# Patient Record
Sex: Female | Born: 1975 | Race: Black or African American | Hispanic: No | Marital: Married | Smoking: Never smoker
Health system: Southern US, Community
[De-identification: ages and names within clinical notes are randomized; demographics above are authoritative.]

## PROBLEM LIST (undated history)

## (undated) DIAGNOSIS — O321XX0 Maternal care for breech presentation, not applicable or unspecified: Secondary | ICD-10-CM

## (undated) DIAGNOSIS — R12 Heartburn: Secondary | ICD-10-CM

## (undated) DIAGNOSIS — I10 Essential (primary) hypertension: Secondary | ICD-10-CM

## (undated) DIAGNOSIS — D649 Anemia, unspecified: Secondary | ICD-10-CM

## (undated) DIAGNOSIS — O26899 Other specified pregnancy related conditions, unspecified trimester: Secondary | ICD-10-CM

## (undated) DIAGNOSIS — Z8619 Personal history of other infectious and parasitic diseases: Secondary | ICD-10-CM

## (undated) DIAGNOSIS — F419 Anxiety disorder, unspecified: Secondary | ICD-10-CM

## (undated) HISTORY — DX: Essential (primary) hypertension: I10

## (undated) HISTORY — DX: Anxiety disorder, unspecified: F41.9

## (undated) HISTORY — DX: Maternal care for breech presentation, not applicable or unspecified: O32.1XX0

## (undated) HISTORY — DX: Personal history of other infectious and parasitic diseases: Z86.19

## (undated) HISTORY — PX: TONSILLECTOMY: SUR1361

## (undated) HISTORY — PX: SALPINGOSTOMY: SHX2372

## (undated) HISTORY — PX: LAPAROSCOPY: SHX197

---

## 2003-02-07 ENCOUNTER — Other Ambulatory Visit: Admission: RE | Admit: 2003-02-07 | Discharge: 2003-02-07 | Payer: Self-pay | Admitting: Obstetrics and Gynecology

## 2003-07-13 ENCOUNTER — Encounter (INDEPENDENT_AMBULATORY_CARE_PROVIDER_SITE_OTHER): Payer: Self-pay

## 2003-07-13 ENCOUNTER — Ambulatory Visit (HOSPITAL_COMMUNITY): Admission: RE | Admit: 2003-07-13 | Discharge: 2003-07-13 | Payer: Self-pay | Admitting: Obstetrics and Gynecology

## 2003-10-20 ENCOUNTER — Ambulatory Visit (HOSPITAL_COMMUNITY): Admission: RE | Admit: 2003-10-20 | Discharge: 2003-10-20 | Payer: Self-pay | Admitting: Obstetrics and Gynecology

## 2003-11-19 ENCOUNTER — Inpatient Hospital Stay (HOSPITAL_COMMUNITY): Admission: AD | Admit: 2003-11-19 | Discharge: 2003-11-19 | Payer: Self-pay | Admitting: Obstetrics and Gynecology

## 2003-12-13 ENCOUNTER — Other Ambulatory Visit: Admission: RE | Admit: 2003-12-13 | Discharge: 2003-12-13 | Payer: Self-pay | Admitting: Obstetrics and Gynecology

## 2004-05-24 ENCOUNTER — Inpatient Hospital Stay (HOSPITAL_COMMUNITY): Admission: AD | Admit: 2004-05-24 | Discharge: 2004-05-24 | Payer: Self-pay | Admitting: Obstetrics and Gynecology

## 2004-06-20 ENCOUNTER — Inpatient Hospital Stay (HOSPITAL_COMMUNITY): Admission: AD | Admit: 2004-06-20 | Discharge: 2004-06-20 | Payer: Self-pay | Admitting: Obstetrics & Gynecology

## 2004-06-25 ENCOUNTER — Inpatient Hospital Stay (HOSPITAL_COMMUNITY): Admission: AD | Admit: 2004-06-25 | Discharge: 2004-06-28 | Payer: Self-pay | Admitting: Obstetrics & Gynecology

## 2004-06-26 ENCOUNTER — Encounter (INDEPENDENT_AMBULATORY_CARE_PROVIDER_SITE_OTHER): Payer: Self-pay | Admitting: Specialist

## 2005-02-19 ENCOUNTER — Encounter (INDEPENDENT_AMBULATORY_CARE_PROVIDER_SITE_OTHER): Payer: Self-pay | Admitting: *Deleted

## 2005-02-19 ENCOUNTER — Ambulatory Visit (HOSPITAL_BASED_OUTPATIENT_CLINIC_OR_DEPARTMENT_OTHER): Admission: RE | Admit: 2005-02-19 | Discharge: 2005-02-19 | Payer: Self-pay | Admitting: Otolaryngology

## 2005-02-19 ENCOUNTER — Ambulatory Visit (HOSPITAL_COMMUNITY): Admission: RE | Admit: 2005-02-19 | Discharge: 2005-02-19 | Payer: Self-pay | Admitting: Otolaryngology

## 2005-10-05 ENCOUNTER — Emergency Department (HOSPITAL_COMMUNITY): Admission: EM | Admit: 2005-10-05 | Discharge: 2005-10-06 | Payer: Self-pay | Admitting: *Deleted

## 2006-10-03 ENCOUNTER — Encounter: Admission: RE | Admit: 2006-10-03 | Discharge: 2006-10-03 | Payer: Self-pay | Admitting: Obstetrics and Gynecology

## 2009-08-14 ENCOUNTER — Emergency Department (HOSPITAL_COMMUNITY): Admission: EM | Admit: 2009-08-14 | Discharge: 2009-08-14 | Payer: Self-pay | Admitting: Family Medicine

## 2011-02-22 NOTE — Op Note (Signed)
NAME:  [name], [name]           ACCOUNT NO.:  [id number]   MEDICAL RECORD NO.:  [id number]          PATIENT TYPE:  AMB   LOCATION:  DSC                          FACILITY:  [facility]   PHYSICIAN:  Christopher E. Ezzard Standing, M.D.DATE OF BIRTH:  22-Feb-1976   DATE OF PROCEDURE:  02/19/2005  DATE OF DISCHARGE:                                 OPERATIVE REPORT   PREOPERATIVE DIAGNOSIS:  Chronic cryptic tonsillitis.   POSTOPERATIVE DIAGNOSIS:  Chronic cryptic tonsillitis.   OPERATION:  Tonsillectomy.   SURGEON:  Kristine Garbe. Ezzard Standing, M.D.   ANESTHESIA:  General endotracheal.   COMPLICATIONS:  None.   BRIEF CLINICAL NOTE:  Dominique Bowen is a 35 year old female who has had  chronic problems with her tonsils for a number of years.  She frequently  gets sore throats, as well as getting a lot of white debris building up  within the tonsil crypts.  She has tried gargling and conservative measures.  She has continued to have problems with white debris building up on her  tonsils and sore throats.  On examination, she has moderately large, 2+,  very cryptic tonsils.  She is taken to the operating room at this time for  tonsillectomy.   DESCRIPTION OF PROCEDURE:  After adequate endotracheal anesthesia, the  patient received 10 mg of Decadron IV preoperatively, as well as 1 g of  Ancef IV preoperatively.  A mouth gag was used to expose the oropharynx.  The left and right tonsils were resected from the tonsillar fossa using  cautery.  Care was taken to preserve the anteroposterior tonsillar pillars,  as well as the uvula.  Hemostasis was obtained with the cautery.  Following  this, the nasopharynx was examined with a mirror.  She had minimal adenoid  tissue.  This completed the procedure.  Shanty was awoke from anesthesia  and transferred to the recovery room.  Postoperatively doing well.   DISPOSITION:  Anola is discharged home later this morning on amoxicillin  suspension 400 mg b.i.d.  for one week, Tylenol/Lortab elixir 15-30 mL q.4h.  p.r.n. pain.  Will have her follow up in my office in two weeks for recheck.      CEN/MEDQ  D:  02/19/2005  T:  02/19/2005  Job:  [id number]

## 2011-02-22 NOTE — Op Note (Signed)
NAMEMarland Kitchen  FREEDA, SPIVEY NO.:  [id number]   MEDICAL RECORD NO.:  [id number]          PATIENT TYPE:  OUT   LOCATION:  DFTL                         FACILITY:  [facility]   PHYSICIAN:  Kristine Garbe. Ezzard Standing, M.D.DATE OF BIRTH:  03-29-1976   DATE OF PROCEDURE:  02/19/2005  DATE OF DISCHARGE:  02/19/2005                                 OPERATIVE REPORT   PREOPERATIVE DIAGNOSIS:  Chronic cryptic tonsillitis.   POSTOPERATIVE DIAGNOSIS:  Same.   PROCEDURE PERFORMED:  Tonsillectomy.   SURGEON:  Kristine Garbe. Ezzard Standing, M.D.   ANESTHESIA:  General endotracheal.   COMPLICATIONS:  None.   BRIEF CLINICAL NOTE:  Dominique Bowen is a 35 year old female who has had  intermittent sore throats with large cryptic tonsils with frequent  tonsilliths.  She is taken to the operating room at this time for  tonsillectomy.   DESCRIPTION OF PROCEDURE:  After adequate endotracheal anesthesia, a mouth  gag was used to expose the oropharynx.  The left and right tonsils were  separated from tonsillar fossa using cautery.  Care was taken to preserve  the anterior and possible tonsillar pillars as well as the uvula.  Hemostasis was obtained with cautery.  Following this, the nasopharynx was  examined.  She has minimal adenoid tissue.  Oropharynx was irrigated with  saline, and this completed the procedure.  The patient was subsequently  awoken from anesthesia and transferred to the recovery room doing well.   DISPOSITION:  Dominique Bowen is discharged home later this morning with amoxicillin  suspension 400 mg b.i.d. for one week.  Tylenol and Lortab elixir 15 to 30  ml q. 4h. p.r.n. pain.  She was told to follow up in my office in 2 weeks  for recheck.      CEN/MEDQ  D:  03/05/2005  T:  03/05/2005  Job:  [id number]

## 2011-02-22 NOTE — H&P (Signed)
NAME:  AVANELL, BANWART                     ACCOUNT NO.:  [id number]   MEDICAL RECORD NO.:  [id number]                   PATIENT TYPE:  MAT   LOCATION:  MATC                                 FACILITY:  [facility]   PHYSICIAN:  Genia Del, M.D.             DATE OF BIRTH:  08/28/1976   DATE OF ADMISSION:  06/25/2004  DATE OF DISCHARGE:                                HISTORY & PHYSICAL   PATIENT IDENTIFICATION:  Mrs. Dasher is a 35 year old G2 P0 A1 at 37+  weeks gestation.   REASON FOR ADMISSION:  Spontaneous rupture of membranes with clear fluid,  starting around 6 a.m. on June 25, 2004.   HISTORY OF PRESENT ILLNESS:  Clear fluid gush vaginally around 6 a.m., then  the fluid continued to leak.  The patient had mild uterine contractions, no  vaginal bleeding, fetal movements positive, no PIH symptoms.   PAST MEDICAL HISTORY:  History of anemia, obesity.   PAST SURGICAL HISTORY:  Laparotomy 2004, ectopic pregnancy.   PAST OBSTETRICAL HISTORY:  G1 - October 2004, 6 weeks, ectopic on the right  side, salpingostomy done.   PAST GYNECOLOGICAL HISTORY:  Recurrent yeast infection, infertility.   FAMILY HISTORY:  Positive for chronic hypertension and diabetes mellitus.   ALLERGIES:  No known drug allergies.  Allergy to AVOCADOS.   CURRENT MEDICATIONS:  Iron supplement and Prenate vitamins.   SOCIAL HISTORY:  Nonsmoker, married.   HISTORY OF PRESENT PREGNANCY:  First trimester spotting which resolved.  Labs in the first trimester showed a hemoglobin at 12.6, platelets 304, O  positive, antibody negative, hemoglobin electrophoresis within normal  limits, RPR nonreactive, HBsAg nonreactive, HIV nonreactive, rubella titer  is immune.  In the second trimester, triple test was within normal limits.  The ultrasound review of anatomy was within normal limits at [facility].  Placenta  was normal.  In the third trimester, 1-hour GTT was normal at 121.  Group B  strep was negative.   Ultrasound for interval growth showed an estimated  fetal weight at the 50th percentile at 29+ weeks, as well as at 35+ weeks.  Fetal well-being surveillance was done closely because of an increased blood  pressure ranging in the 130s to 140s over 80 to 90s after the 31st week.  NSTs and biophysical profiles were reassuring.  Group B strep was negative  at 35 weeks.  PIH labs were within normal limits.   REVIEW OF SYSTEMS:  CONSTITUTIONAL:  Negative.  HEENT:  Negative.  CARDIOVASCULAR, RESPIRATORY, GI, URO, NEURO, DERMATO, ENDOCRINO:  Negative.   PHYSICAL EXAMINATION:  GENERAL:  No apparent distress.  VITAL SIGNS:  Blood pressure was 130 to 140 over 80 to 90 at maternity  admission, respiratory rate normal, regular pulse, afebrile.  LUNGS:  Clear.  HEART:  Regular cardiac rhythm.  ABDOMEN:  Gravid uterine, cephalic presentation.  PELVIC:  Vaginal exam 3 cm dilated, 90% effaced, vertex, -2, clear amniotic  fluid confirmed spontaneous rupture of  membranes.  EXTREMITIES:  Lower limbs normal.   Fetal heart rate monitoring 140s, good variability with accelerations, no  decelerations.  Increased uterine contractions every 3 minutes, lasting 50-  60 seconds.   LABORATORY DATA:  Platelets were 299, hemoglobin 12.4.  AST 20, ALT 16, uric  acid was 6.1, and LDH 128.   IMPRESSION:  Gravida 2 para 0 at 37+ weeks with spontaneous rupture of  membranes entering spontaneous labor.  Fetal well-being reassuring.  Mild  pregnancy-induced hypertension with PIH labs within normal limits except  mildly elevated uric acid at 6.1.   PLAN:  1.  Admit to labor and delivery.  2.  Expectant management towards probable vaginal delivery.  3.  Monitoring.  4.  No magnesium sulfate for now.  5.  If the patient reaches 12 hours after spontaneous rupture of membranes,      will start on antibiotics IV.      ML/MEDQ  D:  06/25/2004  T:  06/25/2004  Job:  [id number]

## 2011-02-22 NOTE — Op Note (Signed)
Dominique Bowen, Dominique Bowen                       ACCOUNT NO.:  [id number]   MEDICAL RECORD NO.:  [id number]                   PATIENT TYPE:  AMB   LOCATION:  SDC                                  FACILITY:  [facility]   PHYSICIAN:  Maxie Better, M.D.            DATE OF BIRTH:  [birth year]   DATE OF PROCEDURE:  07/13/2003  DATE OF DISCHARGE:                                 OPERATIVE REPORT   PREOPERATIVE DIAGNOSES:  1. First trimester vaginal bleeding.  2. Abnormal ultrasound.  3. Abnormal rising quantitative HCG.   PROCEDURE:  1. Dilatation and curettage with frozen section.  2. Operative laparoscopy.  3. Right salpingostomy.   POSTOPERATIVE DIAGNOSES:  1. Right ampullary ectopic pregnancy.  2. Hemoperitoneum.   ANESTHESIA:  General.   SURGEON:  Maxie Better, M.D.   ASSISTANT:  For the laparoscopic portion Genia Del, M.D.   INDICATIONS FOR PROCEDURE:  This is a 35 year old gravida 1, para 0, married  black female.  Last menstrual period around June 17, 2003, x1 day with  known polycystic ovarian syndrome for which she is on Glucophage and who has  anovulatory cycles with a resultant pregnancy secondary to Clomid  conception.  The patient has been followed with quantitative HCG and  subsequent abnormal rising of the HCG has resulted in a repeat ultrasound  being performed on July 13, 2003, which showed a moderate amount of free  fluid, a right abnormal adnexal structure, and no intrauterine pregnancy.  The patient has had minimal cramping on the right side.  She has noticed  increased vaginal bleeding, particularly today.  The patient reports that  her cramping began over the past two days.  After the findings of the  ultrasound the patient was informed that she would need surgical evaluation  and management.  The patient desires preservation of her fallopian tube if  at all possible.  Risks and benefits of planned procedures were explained to  the  patient and re-explained to the patient and her husband when he  presented at the office for the preop visit.  The quantitative HCG today was  1600.  Previous quantitative HCG on July 10, 2003, was 1856.  The patient  had had an ultrasound on July 08, 2003, that did not reveal an  intrauterine pregnancy as a thickened endometrium but both ovaries were  normal at that time.  The patient's exam was unremarkable.  Consent was  signed.  The patient was transferred to the operating room.   FINDINGS:  At the time of surgery is scant tissue in the endometrial cavity,  300 mL of hemoperitoneum, normal uterus, normal left tube and ovary, normal  right ovary, and the distal one-third plus of the right fallopian tube was  dilated and the fimbriated end containing probable products of conception  consistent with an attempted tubal abortion.  Normal appendix, normal liver  edge.   PROCEDURE:  Under adequate general anesthesia, the patient was  placed in the  dorsal lithotomy position.  She was sterilely prepped and draped for both a  diagnostic/operative laparoscopy and dilatation and curettage.  An  indwelling Foley catheter was placed.  The bivalve speculum was placed in  the vagina.  A single-tooth tenaculum was placed on the anterior lip of the  cervix and the cervix was then dilated.  A medium curette was then used to  curettage the cavity.  A scant amount of tissue was obtained which was sent  to the pathologist for frozen section.  An acorn cannula was introduced into  the cervical os and attached to the tenaculum for manipulation of the  uterus.  The bivalve speculum was then removed.  Attention was then turned  to the abdomen where 0.25% Marcaine was injected infraumbilically.  An  infraumbilical incision was then made.  The Veress needle was introduced and  tested using normal saline.  The Veress needle was removed x2 due to no flow  noted.  A longer Veress needle was subsequently  introduced on third attempt.  An an opening pressure of 3 was noted.  About 3.5 liters of carbon dioxide  was subsequently insufflated.  The Veress needle was removed.  A 10-mm  disposable trocar with sleeve was introduced into the abdomen without  incident.  A lighted video laparoscope was then introduced into the abdomen  and of note was a hemoperitoneum noted.  A suprapubic incision was then made  and under direct visualization a 5-mm port was then placed.  A probe was  then used through the lower port to inspect the pelvis and content of the  abdomen.  The abdomen in the upper area was notable for a normal liver edge,  normal appendix.  The uterus was otherwise normal.  The left tube was  normal.  The left ovary was normal.  The right ovary was normal.  The right  tube was distended consistent with an ectopic pregnancy identified all the  way down to the fimbriated end through which you could see either clot  and/or products of conception.  At this time Dr. Seymour Bars entered the case.  The Nezhat was then used to aspirate the hemoperitoneum which contained  about 300 mL total fluid and a third site was placed in the right lower  quadrant.  An incision was made in the right lower quadrant.  A 10-mm  disposable trocar with sleeve was introduced and decision made to preserve  the tube based on the patient's wishes and desires and the location of the  ectopic pregnancy.  A dilute solution of Pitressin was injected on the  antimesenteric border of the tube overlying the ectopic and using the Nezhat  applicator for cauterization, a linear incision was made overlying the  ectopic pregnancy and the tissue was then grasped.  The fallopian tube was  irrigated.  The end of the tube was inspected.  The tissue at the end of the  tube was grasped and removed and again the fallopian tube was inspected.  Additional tissue removed.  The cavity was then irrigated and finally the stream from the distal  portion of the tube on the right was consistent with  a patent tube.  The tissue from the right tube was placed in the posterior  cul-de-sac.  Good hemostasis was then noted at the incision on the tube.  The Nezhat was then used to aspirate the fluid from the pelvis.  The Endobag  was then introduced and the products of  conception from the tube which were  located in the posterior cul-de-sac was then grasped and placed in the bag  and removed.  The abdominal cavity was irrigated, suctioned of debris.  The  upper abdomen was inspected.  Fluid that had been from the irrigation was  subsequently aspirated.  The patient was placed in reverse Trendelenburg.  Additional fluid was removed.  The tube was inspected again, good hemostasis  noted.  At which time decision was then made to terminate the procedure.  The abdomen was partially deflated.  The right tube was again inspected.  Good hemostasis noted.  The lower ports were then removed under direct  visualization.  The abdomen was then deflated and the infraumbilical port  was removed under direct visualization.  Exploration of the right lower  quadrant and the infraumbilical incision with a finger showed that the  fascia was deep in the pelvis and could not be reached to perform a figure-  of-eight closure of the fascia and therefore, this was not performed.  Cautery was then used to obtain hemostasis at both sites and 4-0 Vicryl  interrupted sutures subcuticularly were then used to close all three sites.  All instruments from the vagina were subsequently removed.  The cervix was  inspected.  No lacerations noted.  Specimens were the endometrial curetting  from the dilatation and curettage done at the first portion of the case and  products of conception from the right tube also sent.  Estimated blood loss  from the procedure was minimal.  Intraoperative fluid was 1,800 mL  crystalloid. Urine output was about 30 mL clear concentrated urine.   Complication was none.  The patient tolerated the procedure well, was  transferred to the recovery room in stable condition.                                              Maxie Better, M.D.   Watson/MEDQ  D:  07/13/2003  T:  07/13/2003  Job:  [id number]

## 2011-10-25 LAB — OB RESULTS CONSOLE RUBELLA ANTIBODY, IGM: Rubella: IMMUNE

## 2011-10-25 LAB — OB RESULTS CONSOLE ABO/RH: RH Type: POSITIVE

## 2011-10-25 LAB — OB RESULTS CONSOLE GC/CHLAMYDIA
Chlamydia: NEGATIVE
Gonorrhea: NEGATIVE

## 2011-10-25 LAB — OB RESULTS CONSOLE HIV ANTIBODY (ROUTINE TESTING): HIV: NONREACTIVE

## 2011-10-25 LAB — OB RESULTS CONSOLE ANTIBODY SCREEN: Antibody Screen: NEGATIVE

## 2011-10-25 LAB — OB RESULTS CONSOLE HEPATITIS B SURFACE ANTIGEN: Hepatitis B Surface Ag: NEGATIVE

## 2012-04-14 LAB — OB RESULTS CONSOLE GBS: GBS: NEGATIVE

## 2012-04-27 ENCOUNTER — Telehealth (HOSPITAL_COMMUNITY): Payer: Self-pay | Admitting: *Deleted

## 2012-04-27 ENCOUNTER — Encounter (HOSPITAL_COMMUNITY): Payer: Self-pay | Admitting: *Deleted

## 2012-04-27 NOTE — Telephone Encounter (Signed)
Preadmission screen  

## 2012-04-28 ENCOUNTER — Observation Stay (HOSPITAL_COMMUNITY)
Admission: RE | Admit: 2012-04-28 | Discharge: 2012-04-28 | Disposition: A | Discharge status: Home / Self Care | Payer: 59 | Source: Ambulatory Visit | Attending: Obstetrics and Gynecology | Admitting: Obstetrics and Gynecology

## 2012-04-28 DIAGNOSIS — O321XX0 Maternal care for breech presentation, not applicable or unspecified: Principal | ICD-10-CM | POA: Insufficient documentation

## 2012-04-28 MED ORDER — TERBUTALINE SULFATE 1 MG/ML IJ SOLN
0.2500 mg | Freq: Once | INTRAMUSCULAR | Status: AC
  Administered 2012-04-28: 0.25 mg via SUBCUTANEOUS
  Filled 2012-04-28: qty 1

## 2012-04-28 NOTE — H&P (Signed)
MEHLANI BLANKENBURG is a 36 y.o. [phone] at [redacted]w[redacted]d by ultrasound admitted for eCV due to frank breech presentation  Subjective: No chief complaint on file.   Objective: BP 126/90  Pulse 123  Temp 98.1 F (36.7 C) (Oral)  Resp 20  Ht 5\' 8"  (1.727 m)  Wt 140.161 kg (309 lb)  BMI 46.98 kg/m2  LMP 08/09/2011      FHT:  FHR: 140 bpm, variability: moderate,  accelerations:  Present,  decelerations:  Absent UC:   none SVE:   Deferred Tracing: reactive  Labs: No results found for this basename: WBC, HGB, HCT, MCV, PLT    Assessment / Plan: Frank breech IUP @ 37 + weeks gestation P) terbutaline given. Confirmed breech by bedside sono   Anticipated MOD:  ECV   Barnabas Henriques A 04/28/2012, 8:52 AM  Addendum: multiple attempt at the ECV  W/ intermittent check of fetal heart rate during procedure. Head moved for  Right to left w/o any further turn. Procedure terminated. Will sched Primary C/S

## 2012-04-30 ENCOUNTER — Other Ambulatory Visit: Payer: Self-pay | Admitting: Obstetrics and Gynecology

## 2012-05-02 ENCOUNTER — Encounter (HOSPITAL_COMMUNITY): Payer: Self-pay

## 2012-05-04 ENCOUNTER — Encounter (HOSPITAL_COMMUNITY)
Admission: RE | Admit: 2012-05-04 | Discharge: 2012-05-04 | Disposition: A | Discharge status: Home / Self Care | Payer: 59 | Source: Ambulatory Visit | Attending: Obstetrics and Gynecology | Admitting: Obstetrics and Gynecology

## 2012-05-04 ENCOUNTER — Encounter (HOSPITAL_COMMUNITY): Payer: Self-pay

## 2012-05-04 HISTORY — DX: Other specified pregnancy related conditions, unspecified trimester: O26.899

## 2012-05-04 HISTORY — DX: Heartburn: R12

## 2012-05-04 HISTORY — DX: Anemia, unspecified: D64.9

## 2012-05-04 LAB — CBC
HCT: 34.6 % — ABNORMAL LOW (ref 36.0–46.0)
Hemoglobin: 11.2 g/dL — ABNORMAL LOW (ref 12.0–15.0)
MCH: 26.4 pg (ref 26.0–34.0)
MCV: 81.4 fL (ref 78.0–100.0)
Platelets: 295 10*3/uL (ref 150–400)
RBC: 4.25 MIL/uL (ref 3.87–5.11)
RDW: 14.3 % (ref 11.5–15.5)
WBC: 13.8 10*3/uL — ABNORMAL HIGH (ref 4.0–10.5)

## 2012-05-04 LAB — SURGICAL PCR SCREEN
MRSA, PCR: INVALID — AB
Staphylococcus aureus: INVALID — AB

## 2012-05-04 LAB — TYPE AND SCREEN
ABO/RH(D): O POS
Antibody Screen: NEGATIVE

## 2012-05-04 LAB — ABO/RH: ABO/RH(D): O POS

## 2012-05-04 LAB — RPR: RPR Ser Ql: NONREACTIVE

## 2012-05-04 NOTE — Patient Instructions (Addendum)
Your procedure is scheduled on:05/11/12  Enter through the Main Entrance at :6am Pick up desk phone and dial [phone] and inform us of your arrival.  Please call [phone] if you have any problems the morning of surgery.  Remember: Do not eat after midnight:Sunday Do not drink after:midnight Sunday- water is ok until 3:30 am Monday  Take these meds the morning of surgery with a sip of water:none  DO NOT wear jewelry, eye make-up, lipstick,body lotion, or dark fingernail polish. Do not shave for 48 hours prior to surgery.  If you are to be admitted after surgery, leave suitcase in car until your room has been assigned. Patients discharged on the day of surgery will not be allowed to drive home.   Remember to use your Hibiclens as instructed.

## 2012-05-06 ENCOUNTER — Encounter (HOSPITAL_COMMUNITY): Payer: Self-pay | Admitting: *Deleted

## 2012-05-06 ENCOUNTER — Inpatient Hospital Stay (HOSPITAL_COMMUNITY): Payer: 59 | Admitting: Anesthesiology

## 2012-05-06 ENCOUNTER — Encounter (HOSPITAL_COMMUNITY): Payer: Self-pay | Admitting: Anesthesiology

## 2012-05-06 ENCOUNTER — Encounter (HOSPITAL_COMMUNITY): Payer: Self-pay | Admitting: General Surgery

## 2012-05-06 ENCOUNTER — Encounter (HOSPITAL_COMMUNITY)
Admission: AD | Disposition: A | Discharge status: Home / Self Care | Payer: Self-pay | Source: Ambulatory Visit | Attending: Obstetrics and Gynecology

## 2012-05-06 ENCOUNTER — Inpatient Hospital Stay (HOSPITAL_COMMUNITY)
Admission: AD | Admit: 2012-05-06 | Discharge: 2012-05-08 | DRG: 765 | Disposition: A | Discharge status: Home / Self Care | Payer: 59 | Source: Ambulatory Visit | Attending: Obstetrics and Gynecology | Admitting: Obstetrics and Gynecology

## 2012-05-06 DIAGNOSIS — O9903 Anemia complicating the puerperium: Secondary | ICD-10-CM | POA: Diagnosis not present

## 2012-05-06 DIAGNOSIS — D62 Acute posthemorrhagic anemia: Secondary | ICD-10-CM | POA: Diagnosis not present

## 2012-05-06 DIAGNOSIS — O321XX0 Maternal care for breech presentation, not applicable or unspecified: Principal | ICD-10-CM | POA: Diagnosis present

## 2012-05-06 LAB — CBC
HCT: 34 % — ABNORMAL LOW (ref 36.0–46.0)
MCHC: 32.6 g/dL (ref 30.0–36.0)
Platelets: 300 10*3/uL (ref 150–400)
RDW: 14.2 % (ref 11.5–15.5)
WBC: 15.6 10*3/uL — ABNORMAL HIGH (ref 4.0–10.5)

## 2012-05-06 SURGERY — Surgical Case
Anesthesia: Spinal | Site: Abdomen | Wound class: Clean Contaminated

## 2012-05-06 MED ORDER — PROMETHAZINE HCL 25 MG/ML IJ SOLN: 6.2500 mg | INTRAMUSCULAR | Status: DC | PRN

## 2012-05-06 MED ORDER — SIMETHICONE 80 MG PO CHEW: 80.0000 mg | CHEWABLE_TABLET | ORAL | Status: DC | PRN

## 2012-05-06 MED ORDER — KETOROLAC TROMETHAMINE 60 MG/2ML IM SOLN
60.0000 mg | Freq: Once | INTRAMUSCULAR | Status: AC | PRN
  Administered 2012-05-06: 60 mg via INTRAMUSCULAR

## 2012-05-06 MED ORDER — BISACODYL 10 MG RE SUPP: 10.0000 mg | Freq: Every day | RECTAL | Status: DC | PRN

## 2012-05-06 MED ORDER — FENTANYL CITRATE 0.05 MG/ML IJ SOLN
INTRAMUSCULAR | Status: AC
  Filled 2012-05-06: qty 2

## 2012-05-06 MED ORDER — DIPHENHYDRAMINE HCL 50 MG/ML IJ SOLN: 12.5000 mg | INTRAMUSCULAR | Status: DC | PRN

## 2012-05-06 MED ORDER — WITCH HAZEL-GLYCERIN EX PADS: 1.0000 "application " | MEDICATED_PAD | CUTANEOUS | Status: DC | PRN

## 2012-05-06 MED ORDER — LACTATED RINGERS IV SOLN
INTRAVENOUS | Status: DC | PRN
  Administered 2012-05-06: 08:00:00 via INTRAVENOUS

## 2012-05-06 MED ORDER — DIBUCAINE 1 % RE OINT: 1.0000 "application " | TOPICAL_OINTMENT | RECTAL | Status: DC | PRN

## 2012-05-06 MED ORDER — FLEET ENEMA 7-19 GM/118ML RE ENEM: 1.0000 | ENEMA | Freq: Every day | RECTAL | Status: DC | PRN

## 2012-05-06 MED ORDER — BUPIVACAINE IN DEXTROSE 0.75-8.25 % IT SOLN
INTRATHECAL | Status: DC | PRN
  Administered 2012-05-06: 1.6 mg via INTRATHECAL

## 2012-05-06 MED ORDER — BUPIVACAINE HCL (PF) 0.25 % IJ SOLN
INTRAMUSCULAR | Status: DC | PRN
  Administered 2012-05-06: 8 mL

## 2012-05-06 MED ORDER — ONDANSETRON HCL 4 MG/2ML IJ SOLN
INTRAMUSCULAR | Status: AC
  Filled 2012-05-06: qty 2

## 2012-05-06 MED ORDER — ONDANSETRON HCL 4 MG/2ML IJ SOLN: 4.0000 mg | Freq: Three times a day (TID) | INTRAMUSCULAR | Status: DC | PRN

## 2012-05-06 MED ORDER — LANOLIN HYDROUS EX OINT: 1.0000 "application " | TOPICAL_OINTMENT | CUTANEOUS | Status: DC | PRN

## 2012-05-06 MED ORDER — KETOROLAC TROMETHAMINE 30 MG/ML IJ SOLN: 15.0000 mg | Freq: Once | INTRAMUSCULAR | Status: DC | PRN

## 2012-05-06 MED ORDER — CITRIC ACID-SODIUM CITRATE 334-500 MG/5ML PO SOLN
ORAL | Status: AC
  Filled 2012-05-06: qty 15

## 2012-05-06 MED ORDER — OXYTOCIN 10 UNIT/ML IJ SOLN
INTRAMUSCULAR | Status: AC
  Filled 2012-05-06: qty 4

## 2012-05-06 MED ORDER — CEFAZOLIN SODIUM 1-5 GM-% IV SOLN
1.0000 g | Freq: Once | INTRAVENOUS | Status: DC
  Filled 2012-05-06: qty 50

## 2012-05-06 MED ORDER — KETOROLAC TROMETHAMINE 30 MG/ML IJ SOLN: 30.0000 mg | Freq: Four times a day (QID) | INTRAMUSCULAR | Status: AC | PRN

## 2012-05-06 MED ORDER — CEFAZOLIN SODIUM-DEXTROSE 2-3 GM-% IV SOLR
INTRAVENOUS | Status: AC
  Administered 2012-05-06: 1 g via INTRAVENOUS
  Administered 2012-05-06: 2000 mg via INTRAVENOUS
  Filled 2012-05-06: qty 50

## 2012-05-06 MED ORDER — ONDANSETRON HCL 4 MG/2ML IJ SOLN
INTRAMUSCULAR | Status: DC | PRN
  Administered 2012-05-06: 4 mg via INTRAVENOUS

## 2012-05-06 MED ORDER — LACTATED RINGERS IV SOLN
40.0000 [IU] | INTRAVENOUS | Status: DC | PRN
  Administered 2012-05-06: 40 [IU] via INTRAVENOUS

## 2012-05-06 MED ORDER — ZOLPIDEM TARTRATE 5 MG PO TABS: 5.0000 mg | ORAL_TABLET | Freq: Every evening | ORAL | Status: DC | PRN

## 2012-05-06 MED ORDER — FERROUS SULFATE 325 (65 FE) MG PO TABS
325.0000 mg | ORAL_TABLET | Freq: Two times a day (BID) | ORAL | Status: DC
  Administered 2012-05-06 – 2012-05-08 (×4): 325 mg via ORAL
  Filled 2012-05-06 (×4): qty 1

## 2012-05-06 MED ORDER — SIMETHICONE 80 MG PO CHEW
80.0000 mg | CHEWABLE_TABLET | Freq: Three times a day (TID) | ORAL | Status: DC
  Administered 2012-05-06 – 2012-05-08 (×7): 80 mg via ORAL

## 2012-05-06 MED ORDER — CEFAZOLIN SODIUM-DEXTROSE 2-3 GM-% IV SOLR: 2.0000 g | INTRAVENOUS | Status: DC

## 2012-05-06 MED ORDER — SODIUM CHLORIDE 0.9 % IJ SOLN: 3.0000 mL | INTRAMUSCULAR | Status: DC | PRN

## 2012-05-06 MED ORDER — IBUPROFEN 600 MG PO TABS
600.0000 mg | ORAL_TABLET | Freq: Four times a day (QID) | ORAL | Status: DC
  Administered 2012-05-06 – 2012-05-08 (×8): 600 mg via ORAL
  Filled 2012-05-06 (×8): qty 1

## 2012-05-06 MED ORDER — NALBUPHINE SYRINGE 5 MG/0.5 ML
5.0000 mg | INJECTION | INTRAMUSCULAR | Status: DC | PRN
  Filled 2012-05-06: qty 1

## 2012-05-06 MED ORDER — PRENATAL MULTIVITAMIN CH
1.0000 | ORAL_TABLET | Freq: Every day | ORAL | Status: DC
  Administered 2012-05-07 – 2012-05-08 (×2): 1 via ORAL
  Filled 2012-05-06 (×2): qty 1

## 2012-05-06 MED ORDER — KETOROLAC TROMETHAMINE 60 MG/2ML IM SOLN
INTRAMUSCULAR | Status: AC
  Administered 2012-05-06: 60 mg via INTRAMUSCULAR
  Filled 2012-05-06: qty 2

## 2012-05-06 MED ORDER — DIPHENHYDRAMINE HCL 25 MG PO CAPS: 25.0000 mg | ORAL_CAPSULE | Freq: Four times a day (QID) | ORAL | Status: DC | PRN

## 2012-05-06 MED ORDER — CITRIC ACID-SODIUM CITRATE 334-500 MG/5ML PO SOLN
30.0000 mL | Freq: Once | ORAL | Status: AC
  Administered 2012-05-06: 30 mL via ORAL

## 2012-05-06 MED ORDER — SODIUM CHLORIDE 0.9 % IV SOLN: 250.0000 mL | INTRAVENOUS | Status: DC

## 2012-05-06 MED ORDER — MENTHOL 3 MG MT LOZG: 1.0000 | LOZENGE | OROMUCOSAL | Status: DC | PRN

## 2012-05-06 MED ORDER — ONDANSETRON HCL 4 MG/2ML IJ SOLN
4.0000 mg | INTRAMUSCULAR | Status: DC | PRN
  Administered 2012-05-06: 4 mg via INTRAVENOUS
  Filled 2012-05-06: qty 2

## 2012-05-06 MED ORDER — SODIUM CHLORIDE 0.9 % IV SOLN
1.0000 ug/kg/h | INTRAVENOUS | Status: DC | PRN
  Filled 2012-05-06: qty 2.5

## 2012-05-06 MED ORDER — LACTATED RINGERS IV SOLN
INTRAVENOUS | Status: DC
  Administered 2012-05-06 (×3): via INTRAVENOUS

## 2012-05-06 MED ORDER — HYDROMORPHONE HCL PF 1 MG/ML IJ SOLN
INTRAMUSCULAR | Status: AC
  Administered 2012-05-06: 0.5 mg via INTRAVENOUS
  Filled 2012-05-06: qty 1

## 2012-05-06 MED ORDER — MORPHINE SULFATE (PF) 0.5 MG/ML IJ SOLN
INTRAMUSCULAR | Status: DC | PRN
  Administered 2012-05-06: 4.9 mg via INTRAVENOUS

## 2012-05-06 MED ORDER — FAMOTIDINE IN NACL 20-0.9 MG/50ML-% IV SOLN: 20.0000 mg | Freq: Once | INTRAVENOUS | Status: DC

## 2012-05-06 MED ORDER — ONDANSETRON HCL 4 MG PO TABS: 4.0000 mg | ORAL_TABLET | ORAL | Status: DC | PRN

## 2012-05-06 MED ORDER — BUPIVACAINE-EPINEPHRINE PF 0.25-1:200000 % IJ SOLN
INTRAMUSCULAR | Status: AC
  Filled 2012-05-06: qty 30

## 2012-05-06 MED ORDER — DIPHENHYDRAMINE HCL 25 MG PO CAPS
25.0000 mg | ORAL_CAPSULE | ORAL | Status: DC | PRN
  Filled 2012-05-06: qty 1

## 2012-05-06 MED ORDER — SCOPOLAMINE 1 MG/3DAYS TD PT72
MEDICATED_PATCH | TRANSDERMAL | Status: AC
  Filled 2012-05-06: qty 1

## 2012-05-06 MED ORDER — NALOXONE HCL 0.4 MG/ML IJ SOLN: 0.4000 mg | INTRAMUSCULAR | Status: DC | PRN

## 2012-05-06 MED ORDER — MEPERIDINE HCL 25 MG/ML IJ SOLN
6.2500 mg | INTRAMUSCULAR | Status: DC | PRN
  Administered 2012-05-06: 12.5 mg via INTRAVENOUS
  Administered 2012-05-06: 25 mg via INTRAVENOUS

## 2012-05-06 MED ORDER — MEPERIDINE HCL 25 MG/ML IJ SOLN
INTRAMUSCULAR | Status: AC
Start: 2012-05-06 — End: 2012-05-06
  Administered 2012-05-06: 6.25 mg via INTRAVENOUS
  Filled 2012-05-06: qty 1

## 2012-05-06 MED ORDER — SENNOSIDES-DOCUSATE SODIUM 8.6-50 MG PO TABS
2.0000 | ORAL_TABLET | Freq: Every day | ORAL | Status: DC
  Administered 2012-05-06 – 2012-05-07 (×2): 2 via ORAL

## 2012-05-06 MED ORDER — OXYCODONE-ACETAMINOPHEN 5-325 MG PO TABS
1.0000 | ORAL_TABLET | ORAL | Status: DC | PRN
  Administered 2012-05-07 (×2): 1 via ORAL
  Filled 2012-05-06: qty 2
  Filled 2012-05-06 (×2): qty 1

## 2012-05-06 MED ORDER — SCOPOLAMINE 1 MG/3DAYS TD PT72
1.0000 | MEDICATED_PATCH | Freq: Once | TRANSDERMAL | Status: DC
  Administered 2012-05-06: 1.5 mg via TRANSDERMAL

## 2012-05-06 MED ORDER — SODIUM CHLORIDE 0.9 % IJ SOLN: 3.0000 mL | Freq: Two times a day (BID) | INTRAMUSCULAR | Status: DC

## 2012-05-06 MED ORDER — PHENYLEPHRINE HCL 10 MG/ML IJ SOLN
INTRAMUSCULAR | Status: DC | PRN
  Administered 2012-05-06: 80 ug via INTRAVENOUS

## 2012-05-06 MED ORDER — TETANUS-DIPHTH-ACELL PERTUSSIS 5-2.5-18.5 LF-MCG/0.5 IM SUSP
0.5000 mL | Freq: Once | INTRAMUSCULAR | Status: AC
  Administered 2012-05-07: 0.5 mL via INTRAMUSCULAR

## 2012-05-06 MED ORDER — HYDROMORPHONE HCL PF 1 MG/ML IJ SOLN
0.2500 mg | INTRAMUSCULAR | Status: DC | PRN
  Administered 2012-05-06 (×2): 0.5 mg via INTRAVENOUS

## 2012-05-06 MED ORDER — DIPHENHYDRAMINE HCL 50 MG/ML IJ SOLN: 25.0000 mg | INTRAMUSCULAR | Status: DC | PRN

## 2012-05-06 MED ORDER — METOCLOPRAMIDE HCL 5 MG/ML IJ SOLN
10.0000 mg | Freq: Three times a day (TID) | INTRAMUSCULAR | Status: DC | PRN
  Administered 2012-05-06: 10 mg via INTRAVENOUS
  Filled 2012-05-06: qty 2

## 2012-05-06 MED ORDER — MORPHINE SULFATE (PF) 0.5 MG/ML IJ SOLN
INTRAMUSCULAR | Status: DC | PRN
  Administered 2012-05-06: .1 mg via INTRATHECAL

## 2012-05-06 MED ORDER — MEPERIDINE HCL 25 MG/ML IJ SOLN
6.2500 mg | INTRAMUSCULAR | Status: DC | PRN
  Administered 2012-05-06: 6.25 mg via INTRAVENOUS

## 2012-05-06 MED ORDER — FENTANYL CITRATE 0.05 MG/ML IJ SOLN
INTRAMUSCULAR | Status: DC | PRN
  Administered 2012-05-06: 12.5 ug via INTRATHECAL

## 2012-05-06 MED ORDER — FENTANYL CITRATE 0.05 MG/ML IJ SOLN
INTRAMUSCULAR | Status: DC | PRN
  Administered 2012-05-06: 87.5 ug via INTRAVENOUS

## 2012-05-06 MED ORDER — PHENYLEPHRINE 40 MCG/ML (10ML) SYRINGE FOR IV PUSH (FOR BLOOD PRESSURE SUPPORT)
PREFILLED_SYRINGE | INTRAVENOUS | Status: AC
  Filled 2012-05-06: qty 5

## 2012-05-06 MED ORDER — OXYTOCIN 40 UNITS IN LACTATED RINGERS INFUSION - SIMPLE MED: 62.5000 mL/h | INTRAVENOUS | Status: AC

## 2012-05-06 MED ORDER — MORPHINE SULFATE 0.5 MG/ML IJ SOLN
INTRAMUSCULAR | Status: AC
  Filled 2012-05-06: qty 10

## 2012-05-06 SURGICAL SUPPLY — 28 items
CHLORAPREP W/TINT 26ML (MISCELLANEOUS) ×2 IMPLANT
CLOTH BEACON ORANGE TIMEOUT ST (SAFETY) ×2 IMPLANT
DRSG COVADERM 4X10 (GAUZE/BANDAGES/DRESSINGS) ×2 IMPLANT
ELECT REM PT RETURN 9FT ADLT (ELECTROSURGICAL) ×2
ELECTRODE REM PT RTRN 9FT ADLT (ELECTROSURGICAL) ×1 IMPLANT
GAUZE SPONGE 4X4 12PLY STRL LF (GAUZE/BANDAGES/DRESSINGS) ×4 IMPLANT
GLOVE BIO SURGEON STRL SZ 6.5 (GLOVE) ×4 IMPLANT
GLOVE BIOGEL PI IND STRL 7.0 (GLOVE) ×2 IMPLANT
GLOVE BIOGEL PI INDICATOR 7.0 (GLOVE) ×2
GOWN PREVENTION PLUS LG XLONG (DISPOSABLE) ×4 IMPLANT
GOWN STRL REIN XL XLG (GOWN DISPOSABLE) ×2 IMPLANT
NEEDLE HYPO 25X1 1.5 SAFETY (NEEDLE) ×2 IMPLANT
NS IRRIG 1000ML POUR BTL (IV SOLUTION) ×2 IMPLANT
PACK C SECTION WH (CUSTOM PROCEDURE TRAY) ×2 IMPLANT
PAD ABD 7.5X8 STRL (GAUZE/BANDAGES/DRESSINGS) ×2 IMPLANT
SLEEVE SCD COMPRESS KNEE MED (MISCELLANEOUS) ×2 IMPLANT
SPONGE LAP 18X18 X RAY DECT (DISPOSABLE) ×2 IMPLANT
STAPLER VISISTAT 35W (STAPLE) ×2 IMPLANT
SUT MNCRL 0 VIOLET CTX 36 (SUTURE) ×3 IMPLANT
SUT MONOCRYL 0 CTX 36 (SUTURE) ×3
SUT VIC AB 0 CT1 27 (SUTURE) ×2
SUT VIC AB 0 CT1 27XBRD ANBCTR (SUTURE) ×2 IMPLANT
SUT VIC AB 2-0 CT1 27 (SUTURE) ×1
SUT VIC AB 2-0 CT1 TAPERPNT 27 (SUTURE) ×1 IMPLANT
SYR CONTROL 10ML LL (SYRINGE) ×2 IMPLANT
TOWEL OR 17X24 6PK STRL BLUE (TOWEL DISPOSABLE) ×4 IMPLANT
TRAY FOLEY CATH 14FR (SET/KITS/TRAYS/PACK) ×2 IMPLANT
WATER STERILE IRR 1000ML POUR (IV SOLUTION) ×2 IMPLANT

## 2012-05-06 NOTE — Anesthesia Postprocedure Evaluation (Signed)
  Anesthesia Post-op Note  Patient: Dominique Bowen  Procedure(s) Performed: Procedure(s) (LRB): CESAREAN SECTION (N/A)  Patient Location: Mother/Baby  Anesthesia Type: Spinal  Level of Consciousness: awake  Airway and Oxygen Therapy: Patient Spontanous Breathing  Post-op Pain: mild  Post-op Assessment: Patient's Cardiovascular Status Stable and Respiratory Function Stable  Post-op Vital Signs: stable  Complications: No apparent anesthesia complications

## 2012-05-06 NOTE — MAU Note (Signed)
Pt states SROM 0430

## 2012-05-06 NOTE — Addendum Note (Signed)
Addendum  created 05/06/12 1508 by Renford Dills, CRNA   Modules edited:Notes Section

## 2012-05-06 NOTE — Transfer of Care (Signed)
Immediate Anesthesia Transfer of Care Note  Patient: Dominique Bowen  Procedure(s) Performed: Procedure(s) (LRB): CESAREAN SECTION (N/A)  Patient Location: PACU  Anesthesia Type: Spinal  Level of Consciousness: awake  Airway & Oxygen Therapy: Patient Spontanous Breathing  Post-op Assessment: Report given to PACU RN and Post -op Vital signs reviewed and stable  Post vital signs: stable  Complications: No apparent anesthesia complications

## 2012-05-06 NOTE — Anesthesia Preprocedure Evaluation (Signed)
Anesthesia Evaluation  Patient identified by MRN, date of birth, ID band Patient awake    Reviewed: Allergy & Precautions, H&P , NPO status , Patient's Chart, lab work & pertinent test results  Airway Mallampati: III TM Distance: >3 FB Neck ROM: full    Dental No notable dental hx.    Pulmonary neg pulmonary ROS,    Pulmonary exam normal       Cardiovascular negative cardio ROS      Neuro/Psych negative neurological ROS  negative psych ROS   GI/Hepatic negative GI ROS, Neg liver ROS,   Endo/Other  Morbid obesity  Renal/GU negative Renal ROS  negative genitourinary   Musculoskeletal   Abdominal (+) + obese,   Peds negative pediatric ROS (+)  Hematology negative hematology ROS (+)   Anesthesia Other Findings   Reproductive/Obstetrics (+) Pregnancy                           Anesthesia Physical Anesthesia Plan  ASA: III and Emergent  Anesthesia Plan: Spinal   Post-op Pain Management:    Induction:   Airway Management Planned:   Additional Equipment:   Intra-op Plan:   Post-operative Plan:   Informed Consent: I have reviewed the patients History and Physical, chart, labs and discussed the procedure including the risks, benefits and alternatives for the proposed anesthesia with the patient or authorized representative who has indicated his/her understanding and acceptance.     Plan Discussed with: CRNA and Surgeon  Anesthesia Plan Comments:         Anesthesia Quick Evaluation

## 2012-05-06 NOTE — H&P (Signed)
Dominique Bowen is a 36 y.o. female G3P0111 MBF now @ 38 5/7 weeks presenting for admission 2nd to SROM clear fluid @ 4: 30 am, breech presentation and ctx, Hx PTB was given 17 OHP until 36 wk. Failed ECV  Maternal Medical History:  Reason for admission: Reason for admission: rupture of membranes.  Contractions: Onset was 3-5 hours ago.   Frequency: irregular.   Perceived severity is moderate.    Fetal activity: Perceived fetal activity is normal.    Prenatal complications: no prenatal complications Prenatal Complications - Diabetes: none.    OB History    Grav Para Term Preterm Abortions TAB SAB Ect Mult Living   3 1 0 1 1  1   1      Past Medical History  Diagnosis Date  . Breech presentation without mention of version, unspecified as to episode of care   . H/O varicella   . Hypertension     only when pregnant  . Anxiety     panic disorder  . Heartburn in pregnancy   . Anemia     in the past   Past Surgical History  Procedure Date  . Laparoscopy   . Tonsillectomy   . Salpingostomy    Family History: family history includes Cancer in her paternal grandfather; Diabetes in her paternal aunt; Heart attack in her paternal grandfather; Hypertension in her father; and Hypothyroidism in her father. Social History:  reports that she has never smoked. She has never used smokeless tobacco. She reports that she does not drink alcohol or use illicit drugs.   Prenatal Transfer Tool  Maternal Diabetes: No Genetic Screening: Declined Maternal Ultrasounds/Referrals: Normal Fetal Ultrasounds or other Referrals:  Fetal echo Maternal Substance Abuse:  No Significant Maternal Medications:  None Significant Maternal Lab Results:  Lab values include: Group B Strep negative Other Comments:  fetal echo done due to inability to see cardiac structures well on routine sono. study was normal  ROS  Dilation: Fingertip Effacement (%): 50 Station: -3 Exam by:: B Erdy RN Blood pressure  142/94, pulse 126, temperature 98 F (36.7 C), temperature source Oral, resp. rate 24, height 5\' 8"  (1.727 m), weight 141.976 kg (313 lb), last menstrual period 08/09/2011, SpO2 99.00%. Maternal Exam:  Abdomen: Patient reports no abdominal tenderness. Fundal height is term.   Estimated fetal weight is 8lb.   Fetal presentation: breech  Introitus: Normal vulva. Ferning test: positive.  Amniotic fluid character: clear.  Cervix: Cervix evaluated by digital exam.     Physical Exam  Constitutional: She is oriented to person, place, and time. She appears well-developed and well-nourished.  HENT:  Head: Atraumatic.  Eyes: EOM are normal.  Neck: Neck supple.  Cardiovascular: Regular rhythm.   Respiratory: Breath sounds normal.  GI: Soft.  Musculoskeletal: Normal range of motion.  Neurological: She is alert and oriented to person, place, and time.  Skin: Skin is warm and dry.  Psychiatric: She has a normal mood and affect.   Bedside sonO; Breech Prenatal labs: ABO, Rh: --/--/O POS (07/29 0950) Antibody: NEG (07/29 0947) Rubella: Immune (01/18 0000) RPR: NON REACTIVE (07/29 0946)  HBsAg: Negative (01/18 0000)  HIV: Non-reactive (01/18 0000)  GBS: Negative (07/09 0000)   Assessment/Plan: Breech SROM IUP @ 38 5/7 weeks P) admit. Routine labs. Primary C/S.  Primary C/S: risk of surgery reviewed w/ pt: infection, bleeding, injury to bladder, bowel, ureter, internal scar tissue, poss need for transfusion and its risk   Galia Rahm A 05/06/2012, 7:00 AM

## 2012-05-06 NOTE — Anesthesia Procedure Notes (Signed)
Spinal  Patient location during procedure: OR Start time: 05/06/2012 7:35 AM End time: 05/06/2012 7:43 AM Staffing Anesthesiologist: Sandrea Hughs Preanesthetic Checklist Completed: patient identified, site marked, surgical consent, pre-op evaluation, timeout performed, IV checked, risks and benefits discussed and monitors and equipment checked Spinal Block Patient position: sitting Prep: DuraPrep Patient monitoring: heart rate, cardiac monitor, continuous pulse ox and blood pressure Approach: midline Location: L3-4 Injection technique: single-shot Needle Needle type: Sprotte  Needle gauge: 24 G Needle length: 9 cm Needle insertion depth: 11 cm Assessment Sensory level: T4 Additional Notes Needed 18G Touhy X 1 to 8cm followed by 24G Sprotte to + CSF

## 2012-05-06 NOTE — Brief Op Note (Signed)
05/06/2012  8:52 AM  PATIENT:  Dominique Bowen  36 y.o. female  PRE-OPERATIVE DIAGNOSIS:  SROM, Breech Presentation, Early Labor, IUP @ 38 5/7 weeks  POST-OPERATIVE DIAGNOSIS:  SROM,  [place] , Early Labor, IUP @ 38 5/7 weeks delivered  PROCEDURE:  Procedure(s) (LRB): PRIMARY LOW TRANSVERSE CESAREAN SECTION (N/A)  SURGEON:  Surgeon(s) and Role:    * Mohsin Crum Cathie Beams, MD - Primary  PHYSICIAN ASSISTANT:   ASSISTANTS: none   ANESTHESIA:   spinal  Findings: frank breech, nl tubes and ovaries, live female. Placenta intact but removal partial hard to do, Apgar 8/9  EBL:  Total I/O In: 2200 [I.V.:2200] Out: 860 [Urine:160; Blood:700]  BLOOD ADMINISTERED:none  DRAINS: none   LOCAL MEDICATIONS USED:  MARCAINE     SPECIMEN:  Source of Specimen:  placenta  DISPOSITION OF SPECIMEN:  PATHOLOGY  COUNTS:  YES  TOURNIQUET:  * No tourniquets in log *  DICTATION: .Other Dictation: Dictation Number [id number]  PLAN OF CARE: Admit to inpatient   PATIENT DISPOSITION:  PACU - hemodynamically stable.   Delay start of Pharmacological VTE agent (>24hrs) due to surgical blood loss or risk of bleeding: no

## 2012-05-06 NOTE — Anesthesia Postprocedure Evaluation (Signed)
Anesthesia Post Note  Patient: Dominique Bowen  Procedure(s) Performed: Procedure(s) (LRB): CESAREAN SECTION (N/A)  Anesthesia type: Spinal  Patient location: PACU  Post pain: Pain level controlled  Post assessment: Post-op Vital signs reviewed  Last Vitals:  Filed Vitals:   05/06/12 0630  BP: 142/94  Pulse: 126  Temp:   Resp: 24    Post vital signs: Reviewed  Level of consciousness: awake  Complications: No apparent anesthesia complications

## 2012-05-07 ENCOUNTER — Encounter (HOSPITAL_COMMUNITY): Payer: Self-pay | Admitting: Obstetrics and Gynecology

## 2012-05-07 LAB — MRSA CULTURE

## 2012-05-07 LAB — CBC
HCT: 29.4 % — ABNORMAL LOW (ref 36.0–46.0)
Hemoglobin: 9.5 g/dL — ABNORMAL LOW (ref 12.0–15.0)
MCH: 26.6 pg (ref 26.0–34.0)
MCHC: 32.3 g/dL (ref 30.0–36.0)

## 2012-05-07 NOTE — Progress Notes (Signed)
Patient ID: Dominique Bowen, female   DOB: 10/16/1975, 36 y.o.   MRN: [id number]  POSTOPERATIVE DAY # 1 S/P cesarean section for breech in labor  S:         Reports feeling ok - just really tired / not much sleep last PM with breastfeeding             Tolerating po intake / no nausea / no vomiting / +flatus / no BM             Bleeding is light             Pain controlled with motrin and percocet             Up ad lib / ambulatory  Newborn breast feeding    O:  A & O x 3    VS: Blood pressure 134/84, pulse 106, temperature 98.3 F (36.8 C), temperature source Oral, resp. rate 18, height 5\' 8"  (1.727 m), weight 141.976 kg (313 lb), last menstrual period 08/09/2011, SpO2 98.00%, unknown if currently breastfeeding.   LABS: WBC/Hgb/Hct/Plts:  13.5/9.5/29.4/252 (08/01 4098)   I&O:   + 400  Lungs: Clear and unlabored  Heart: regular rate and rhythm / no mumurs  Abdomen: soft, non-tender, non-distended, active BS             Fundus: firm, non-tender, U-1             Dressing intact without drainage        Perineum: no edema  Lochia: light  Extremities: trace edema, no calf pain or tenderness  A:        POD # 1 S/P cesarean section  P:        Routine postoperative care              Advance activity as tolerated   Marlinda Mike CNM, MSN 05/07/2012, 8:12 AM

## 2012-05-07 NOTE — Op Note (Signed)
Dominique Bowen, Dominique Bowen NO.:  [id number]  MEDICAL RECORD NO.:  [id number]  LOCATION:  9115                          FACILITY:  [facility]  PHYSICIAN:  Maxie Better, M.D.DATE OF BIRTH:  November 17, 1975  DATE OF PROCEDURE:  05/06/2012 DATE OF DISCHARGE:                              OPERATIVE REPORT   PREOPERATIVE DIAGNOSES:  Spontaneous rupture of membranes, breech presentation, early labor, intrauterine gestation at 38-5/7 weeks.  PROCEDURES:  A primary low transverse cesarean section.  POSTOPERATIVE DIAGNOSES:  Homero Fellers breech presentation, spontaneous rupture of membranes, early labor, intrauterine gestation at 38-5/7 weeks delivered.  ANESTHESIA:  Spinal.  SURGEON:  Maxie Better, MD.  ASSISTANT:  None.  PROCEDURE:  Under adequate spinal anesthesia, the patient was placed in the supine position with a left lateral tilt.  She was ChloraPrep and Betadine prepped in the usual fashion.  The indwelling Foley catheter was sterilely placed. 0.25% Marcaine was injected along the planned Pfannenstiel skin incision site.  Pfannenstiel skin incision was then made, carried down to the rectus fascia.  The rectus fascia was opened transversely.  Rectus fascia then bluntly and sharply dissected off the rectus muscle in superior and inferior fashion.  The rectus muscle was split in midline.  The parietal peritoneum was entered bluntly and extended.  The vesicouterine peritoneum was opened transversely.  The bladder was then bluntly dissected off the liver in its lower uterine segment displaced inferiorly.  A curvilinear low transverse uterine incision was then made and extended with bandage scissors.  Subsequent delivery from frank breech position was accomplished in the usual breech maneuvers.  The baby was bulb suctioned.  The abdomen and the cord was clamped, cut.  The baby was transferred to the awaiting pediatrician who assigned Apgars 8 and 9 at 1 and 5 minutes.  The  placenta was slow for removal, but it was removed, intact, and sent to pathology.  Uterine cavity was cleaned of debris.  Uterine incision had no extension was closed in 2 layers, the first layer was 0 Monocryl running locked stitch, second layer was imbricated using 0 Monocryl suture.  Figure-of- eight suture x2 was placed on the right-sided incision freeing additional hemostasis.  Normal tubes and ovaries were noted bilaterally. Omental single adhesion to the right sidewall was lysed.  The abdomen was then copiously irrigated and suctioned debris with good hemostasis noted.  The parietal peritoneum was closed with 2-0 Vicryl.  The rectus fascia was closed with 0 Vicryl x2.  The subcutaneous areas irrigated. Small bleeders cauterized.  Interrupted 2-0 plain sutures placed and the skin approximated using Ethicon staples.  SPECIMEN:  Placenta sent to pathology.  ESTIMATED BLOOD LOSS:  700 mL.  INTRAOPERATIVE FLUID:  2 L.  URINE OUTPUT:  160 mL concentrated urine.  Sponge and instrument counts x2 was correct.  COMPLICATION:  None.  The patient tolerated the procedure well, was transferred to recovery in stable condition.     Maxie Better, M.D.     /MEDQ  D:  05/06/2012  T:  05/07/2012  Job:  [id number]

## 2012-05-08 MED ORDER — FERRALET 90 90-1 MG PO TABS: 1.0000 | ORAL_TABLET | Freq: Every day | ORAL | Status: AC

## 2012-05-08 MED ORDER — DOCUSATE SODIUM 100 MG PO CAPS: 100.0000 mg | ORAL_CAPSULE | Freq: Two times a day (BID) | ORAL | Status: AC | PRN

## 2012-05-08 MED ORDER — IBUPROFEN 600 MG PO TABS: 600.0000 mg | ORAL_TABLET | Freq: Four times a day (QID) | ORAL | Status: AC

## 2012-05-08 MED ORDER — OXYCODONE-ACETAMINOPHEN 5-325 MG PO TABS: 1.0000 | ORAL_TABLET | Freq: Four times a day (QID) | ORAL | Status: AC | PRN

## 2012-05-08 NOTE — Progress Notes (Signed)
Patient ID: Dominique Bowen, female   DOB: 11/04/1975, 36 y.o.   MRN: [id number] POD # 2  Subjective: Pt reports feeling well and eager for early d/c home/ Pain controlled with ibuprofen and percocet Tolerating po/Voiding without problems/ No n/v/Flatus pos Activity: out of bed and ambulate Bleeding is light Newborn info:  Information for the patient's newborn:  Makaley, Storts [[id number]]  female Feeding: breast   Objective: VS: Blood pressure 137/86, pulse 100, temperature 98.3 F (36.8 C), temperature source Oral, resp. rate 18, height 5\' 8"  (1.727 m), weight 141.976 kg (313 lb), last menstrual period 08/09/2011, SpO2 98.00%, unknown if currently breastfeeding.   LABS:  Basename 05/07/12 0628 05/06/12 0635  WBC 13.5* 15.6*  HGB 9.5* 11.1*  HCT 29.4* 34.0*  PLT 252 300     Physical Exam:  General: alert, cooperative and no distress CV: Regular rate and rhythm Resp: clear Abdomen: soft, nontender, normal bowel sounds Incision: healing well, no drainage, no erythema, well approximated Uterine Fundus: firm, below umbilicus, nontender Ext: edema trace and Homans sign is negative, no sign of DVT    A/P: POD # 3/ [id number]/ S/P C/Section d/t breech Doing well and stable for discharge home ABL Anemia RX's: Ibuprofen 600mg  po Q 6 hrs prn pain #30 Refill x 1 Percocet 5/325 1 - 2 tabs po every 6 hrs prn pain  #30 No refill Niferex 150mg  po QD/BID #30/#60 Refill x 1 Staple removal in the office on 05/11/12 @ 1145am    Signed: Demetrius Revel, MSN, [facility] 05/08/2012, 9:45 AM

## 2012-05-08 NOTE — Discharge Summary (Signed)
Obstetric Discharge Summary Reason for Admission: SROM @ 38w 5d and planned C/S for Breech Prenatal Procedures: NST and ultrasound Intrapartum Procedures: cesarean: low cervical, transverse Postpartum Procedures: none Complications-Operative and Postpartum: none Hemoglobin  Date Value Range Status  05/07/2012 9.5* 12.0 - 15.0 g/dL Final     HCT  Date Value Range Status  05/07/2012 29.4* 36.0 - 46.0 % Final    Physical Exam:  General: alert, cooperative and no distress Lochia: appropriate Uterine Fundus: firm Incision: healing well DVT Evaluation: No evidence of DVT seen on physical exam. Negative Homan's sign.  Discharge Diagnoses: Term Pregnancy-delivered  Discharge Information: Date: 05/08/2012 Activity: pelvic rest Diet: routine Medications: Ibuprofen, Colace, Iron and Percocet Condition: stable Instructions: refer to practice specific booklet Discharge to: home   Newborn Data: Live born female 05/06/2012 Birth Weight: 7 lb 5.3 oz (3325 g) APGAR: 8, 9  Home with mother.  Clyde Zarrella K 05/08/2012, 9:55 AM

## 2012-05-11 ENCOUNTER — Inpatient Hospital Stay (HOSPITAL_COMMUNITY): Admission: RE | Admit: 2012-05-11 | Payer: 59 | Source: Ambulatory Visit | Admitting: Obstetrics and Gynecology

## 2012-05-11 ENCOUNTER — Encounter (HOSPITAL_COMMUNITY): Admission: RE | Payer: Self-pay | Source: Ambulatory Visit

## 2012-05-11 ENCOUNTER — Inpatient Hospital Stay (HOSPITAL_COMMUNITY): Admission: RE | Admit: 2012-05-11 | Payer: 59 | Source: Home / Self Care | Admitting: Obstetrics and Gynecology

## 2012-05-11 SURGERY — Surgical Case
Anesthesia: Regional

## 2012-05-11 SURGERY — Surgical Case
Anesthesia: Spinal

## 2014-08-08 ENCOUNTER — Encounter (HOSPITAL_COMMUNITY): Payer: Self-pay | Admitting: Obstetrics and Gynecology

## 2014-10-18 ENCOUNTER — Other Ambulatory Visit: Payer: Self-pay | Admitting: Family Medicine

## 2014-10-21 ENCOUNTER — Other Ambulatory Visit: Payer: Self-pay | Admitting: Family Medicine

## 2014-10-24 ENCOUNTER — Other Ambulatory Visit: Payer: Self-pay | Admitting: Family Medicine

## 2014-10-25 NOTE — Telephone Encounter (Signed)
Suzy, [facility] but it appears she hasn't been here for a while. I would think we need to deny this med RF? Did we refer her to anyone or OK her to go back to work and close the case? Please let me know any info you can see. Thanks!

## 2014-11-10 ENCOUNTER — Other Ambulatory Visit: Payer: Self-pay | Admitting: Family Medicine

## 2015-01-08 ENCOUNTER — Other Ambulatory Visit: Payer: Self-pay | Admitting: Family Medicine

## 2015-01-24 ENCOUNTER — Other Ambulatory Visit: Payer: Self-pay | Admitting: Family Medicine

## 2015-01-25 ENCOUNTER — Other Ambulatory Visit: Payer: Self-pay | Admitting: Family Medicine

## 2015-09-26 NOTE — Telephone Encounter (Signed)
Dominique MccreedyBarbara,  Are you able to close this out, so I can take this out of my in box?

## 2018-10-13 ENCOUNTER — Other Ambulatory Visit: Payer: Self-pay | Admitting: Obstetrics and Gynecology

## 2018-10-13 DIAGNOSIS — N6489 Other specified disorders of breast: Secondary | ICD-10-CM

## 2018-10-20 ENCOUNTER — Ambulatory Visit
Admission: RE | Admit: 2018-10-20 | Discharge: 2018-10-20 | Disposition: A | Discharge status: Home / Self Care | Payer: 59 | Source: Ambulatory Visit | Attending: Obstetrics and Gynecology | Admitting: Obstetrics and Gynecology

## 2018-10-20 ENCOUNTER — Other Ambulatory Visit: Payer: Self-pay | Admitting: Obstetrics and Gynecology

## 2018-10-20 DIAGNOSIS — N6489 Other specified disorders of breast: Secondary | ICD-10-CM

## 2019-04-21 ENCOUNTER — Ambulatory Visit
Admission: RE | Admit: 2019-04-21 | Discharge: 2019-04-21 | Disposition: A | Discharge status: Home / Self Care | Payer: Federal, State, Local not specified - PPO | Source: Ambulatory Visit | Attending: Obstetrics and Gynecology | Admitting: Obstetrics and Gynecology

## 2019-04-21 ENCOUNTER — Other Ambulatory Visit: Payer: Self-pay

## 2019-04-21 ENCOUNTER — Other Ambulatory Visit: Payer: Self-pay | Admitting: Obstetrics and Gynecology

## 2019-04-21 ENCOUNTER — Ambulatory Visit
Admission: RE | Admit: 2019-04-21 | Discharge: 2019-04-21 | Disposition: A | Discharge status: Home / Self Care | Payer: 59 | Source: Ambulatory Visit | Attending: Obstetrics and Gynecology | Admitting: Obstetrics and Gynecology

## 2019-04-21 DIAGNOSIS — N6489 Other specified disorders of breast: Secondary | ICD-10-CM

## 2019-04-21 DIAGNOSIS — N6311 Unspecified lump in the right breast, upper outer quadrant: Secondary | ICD-10-CM | POA: Diagnosis not present

## 2019-04-21 DIAGNOSIS — R928 Other abnormal and inconclusive findings on diagnostic imaging of breast: Secondary | ICD-10-CM | POA: Diagnosis not present

## 2019-06-22 DIAGNOSIS — H1045 Other chronic allergic conjunctivitis: Secondary | ICD-10-CM | POA: Diagnosis not present

## 2019-10-29 ENCOUNTER — Other Ambulatory Visit: Payer: Federal, State, Local not specified - PPO

## 2019-11-03 ENCOUNTER — Other Ambulatory Visit: Payer: Federal, State, Local not specified - PPO

## 2019-12-06 ENCOUNTER — Ambulatory Visit
Admission: RE | Admit: 2019-12-06 | Discharge: 2019-12-06 | Disposition: A | Discharge status: Home / Self Care | Payer: Federal, State, Local not specified - PPO | Source: Ambulatory Visit | Attending: Obstetrics and Gynecology | Admitting: Obstetrics and Gynecology

## 2019-12-06 ENCOUNTER — Other Ambulatory Visit: Payer: Self-pay

## 2019-12-06 DIAGNOSIS — N6489 Other specified disorders of breast: Secondary | ICD-10-CM

## 2019-12-06 DIAGNOSIS — N6011 Diffuse cystic mastopathy of right breast: Secondary | ICD-10-CM | POA: Diagnosis not present

## 2019-12-06 DIAGNOSIS — R928 Other abnormal and inconclusive findings on diagnostic imaging of breast: Secondary | ICD-10-CM | POA: Diagnosis not present

## 2019-12-06 DIAGNOSIS — Z20828 Contact with and (suspected) exposure to other viral communicable diseases: Secondary | ICD-10-CM | POA: Diagnosis not present

## 2019-12-31 ENCOUNTER — Ambulatory Visit: Payer: Federal, State, Local not specified - PPO | Attending: Internal Medicine

## 2019-12-31 DIAGNOSIS — Z23 Encounter for immunization: Secondary | ICD-10-CM

## 2019-12-31 NOTE — Progress Notes (Signed)
   Covid-19 Vaccination Clinic  Name:  Dominique Bowen    MRN: [id number] DOB: [birth year]  12/31/2019  Dominique Bowen was observed post Covid-19 immunization for 15 minutes without incident. She was provided with Vaccine Information Sheet and instruction to access the V-Safe system.   Dominique Bowen was instructed to call 911 with any severe reactions post vaccine: Marland Kitchen Difficulty breathing  . Swelling of face and throat  . A fast heartbeat  . A bad rash all over body  . Dizziness and weakness   Immunizations Administered    Name Date Dose VIS Date Route   Pfizer COVID-19 Vaccine 12/31/2019  5:02 PM 0.3 mL 09/17/2019 Intramuscular   Manufacturer: [employer], [employer]   Lot: [id number]   NDC: [address]-0

## 2020-01-26 ENCOUNTER — Ambulatory Visit: Payer: Federal, State, Local not specified - PPO | Attending: Internal Medicine

## 2020-01-26 DIAGNOSIS — Z23 Encounter for immunization: Secondary | ICD-10-CM

## 2020-01-26 NOTE — Progress Notes (Signed)
   Covid-19 Vaccination Clinic  Name:  Dominique Bowen    MRN: [id number] DOB: 1976/02/08  01/26/2020  Dominique Bowen was observed post Covid-19 immunization for 15 minutes without incident. She was provided with Vaccine Information Sheet and instruction to access the V-Safe system.   Dominique Bowen was instructed to call 911 with any severe reactions post vaccine: Marland Kitchen Difficulty breathing  . Swelling of face and throat  . A fast heartbeat  . A bad rash all over body  . Dizziness and weakness   Immunizations Administered    Name Date Dose VIS Date Route   Pfizer COVID-19 Vaccine 01/26/2020  4:32 PM 0.3 mL 12/01/2018 Intramuscular   Manufacturer: [employer], [employer]   Lot: [id number]   NDC: [address]-0

## 2020-02-21 DIAGNOSIS — Z01419 Encounter for gynecological examination (general) (routine) without abnormal findings: Secondary | ICD-10-CM | POA: Diagnosis not present

## 2020-02-21 DIAGNOSIS — Z6841 Body Mass Index (BMI) 40.0 and over, adult: Secondary | ICD-10-CM | POA: Diagnosis not present

## 2020-02-21 DIAGNOSIS — Z1151 Encounter for screening for human papillomavirus (HPV): Secondary | ICD-10-CM | POA: Diagnosis not present

## 2020-02-23 DIAGNOSIS — R635 Abnormal weight gain: Secondary | ICD-10-CM | POA: Diagnosis not present

## 2021-01-05 ENCOUNTER — Other Ambulatory Visit: Payer: Self-pay | Admitting: Obstetrics and Gynecology

## 2021-01-05 DIAGNOSIS — Z1231 Encounter for screening mammogram for malignant neoplasm of breast: Secondary | ICD-10-CM

## 2021-02-09 ENCOUNTER — Other Ambulatory Visit: Payer: Self-pay

## 2021-02-09 ENCOUNTER — Ambulatory Visit
Admission: RE | Admit: 2021-02-09 | Discharge: 2021-02-09 | Disposition: A | Discharge status: Home / Self Care | Payer: Federal, State, Local not specified - PPO | Source: Ambulatory Visit | Attending: Obstetrics and Gynecology | Admitting: Obstetrics and Gynecology

## 2021-02-09 ENCOUNTER — Other Ambulatory Visit: Payer: Self-pay | Admitting: Obstetrics and Gynecology

## 2021-02-09 DIAGNOSIS — N6489 Other specified disorders of breast: Secondary | ICD-10-CM

## 2021-02-09 DIAGNOSIS — Z1231 Encounter for screening mammogram for malignant neoplasm of breast: Secondary | ICD-10-CM

## 2022-09-06 ENCOUNTER — Other Ambulatory Visit: Payer: Self-pay | Admitting: Obstetrics and Gynecology

## 2022-09-06 DIAGNOSIS — Z1231 Encounter for screening mammogram for malignant neoplasm of breast: Secondary | ICD-10-CM

## 2022-10-30 ENCOUNTER — Ambulatory Visit
Admission: RE | Admit: 2022-10-30 | Discharge: 2022-10-30 | Disposition: A | Discharge status: Home / Self Care | Payer: Federal, State, Local not specified - PPO | Source: Ambulatory Visit | Attending: Obstetrics and Gynecology | Admitting: Obstetrics and Gynecology

## 2022-10-30 DIAGNOSIS — Z1231 Encounter for screening mammogram for malignant neoplasm of breast: Secondary | ICD-10-CM

## 2023-02-17 IMAGING — MG DIGITAL DIAGNOSTIC BILAT W/ TOMO W/ CAD
8 series · 8 of 24 positions shown · non-contrast
Comparison: Previous exams.

CLINICAL DATA: Follow-up for probably benign right breast mass felt
to be related to focal fibroglandular tissue and fibrocystic change.

EXAM:
DIGITAL DIAGNOSTIC BILATERAL MAMMOGRAM WITH TOMOSYNTHESIS AND CAD;
ULTRASOUND RIGHT BREAST LIMITED
TECHNIQUE: Bilateral digital diagnostic mammography and breast tomosynthesis
was performed. The images were evaluated with computer-aided
detection.; Targeted ultrasound examination of the right breast was
performed

[R MLO synth-2D]
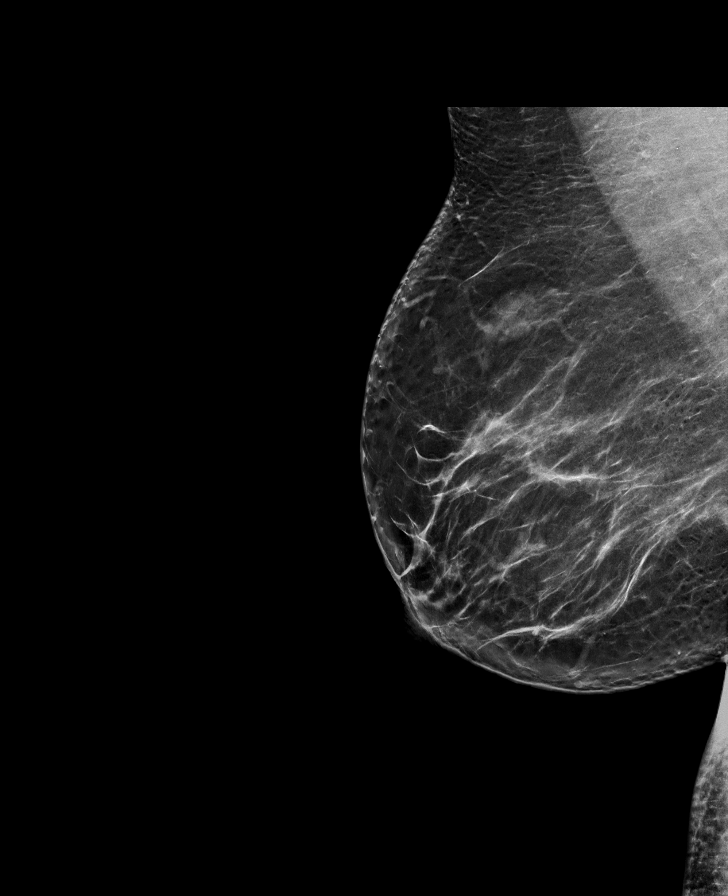

[R CC synth-2D]
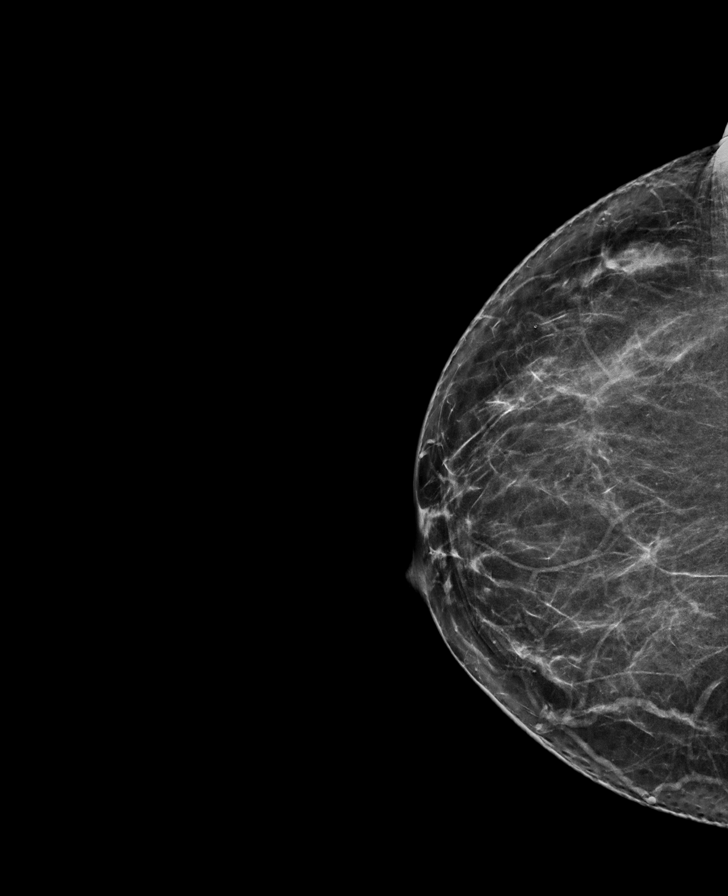

[L MLO synth-2D]
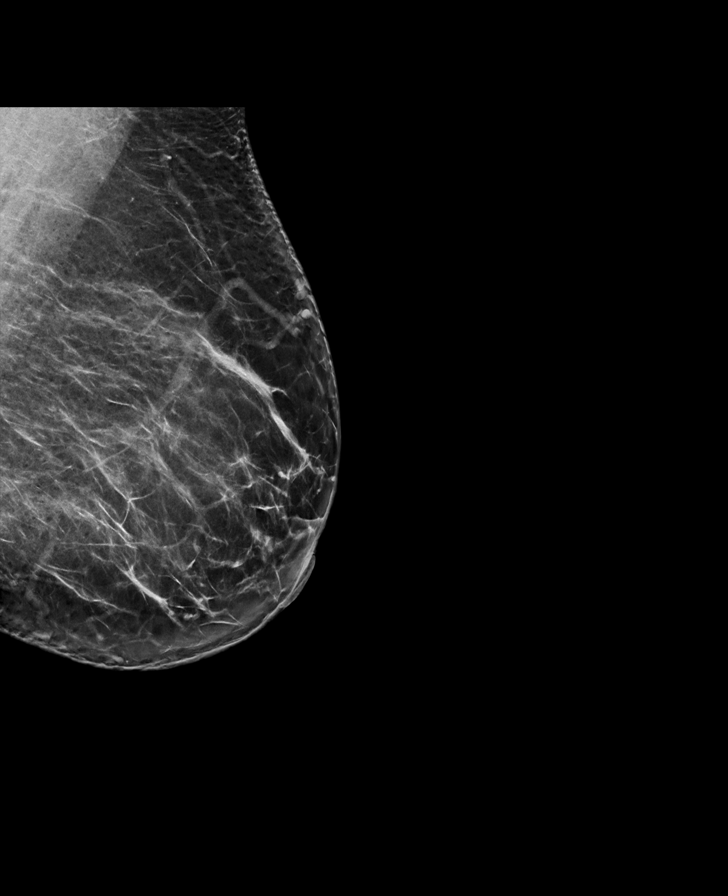

[L CC synth-2D]
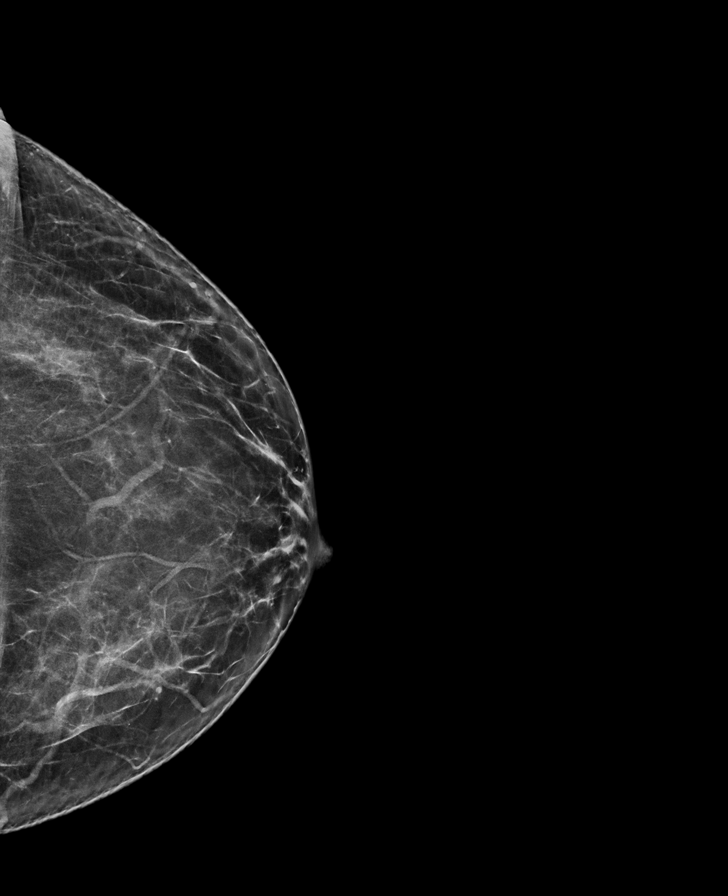

[R MLO tomo · tomo slice 43/85.0]
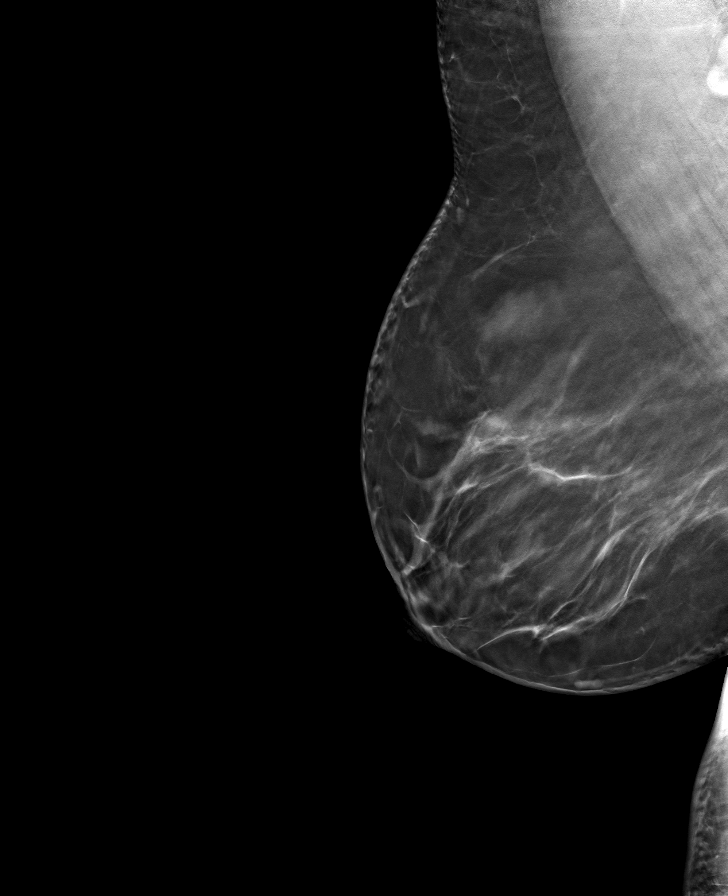

[L CC tomo · tomo slice 34/67.0]
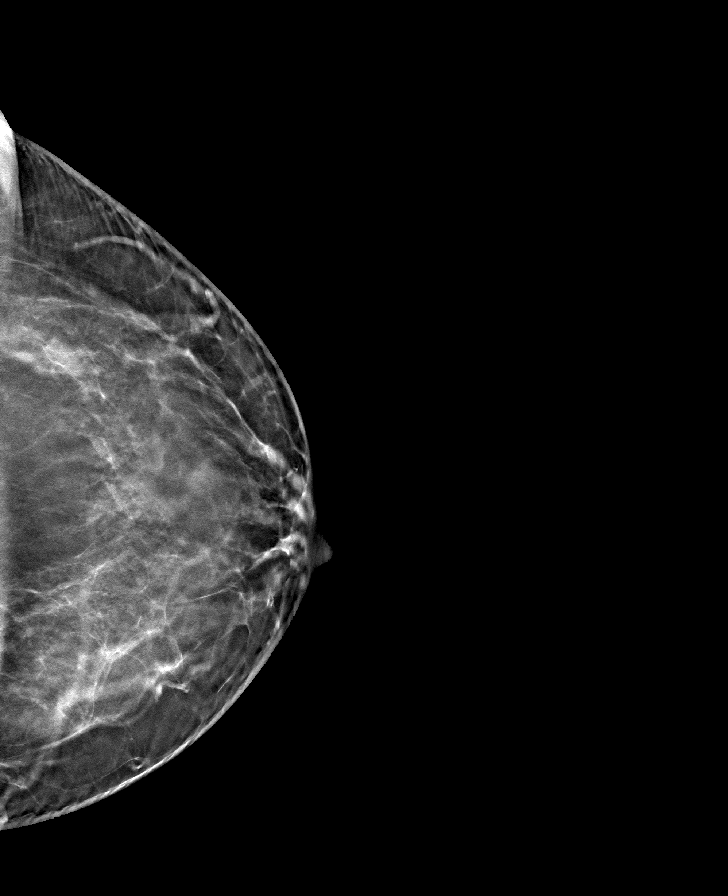

[R CC tomo · tomo slice 35/69.0]
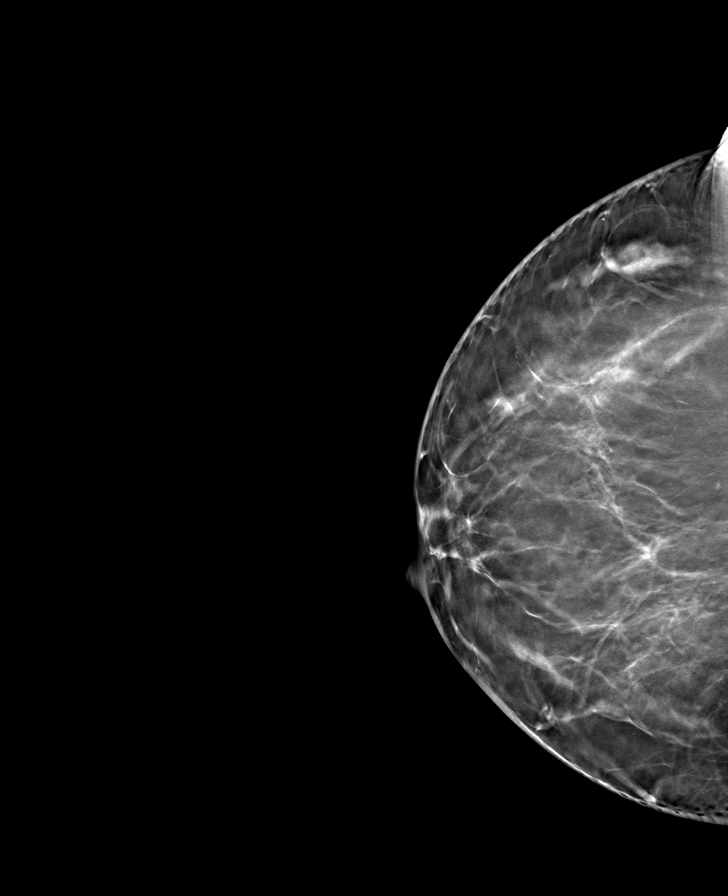

[L MLO tomo · tomo slice 42/83.0]
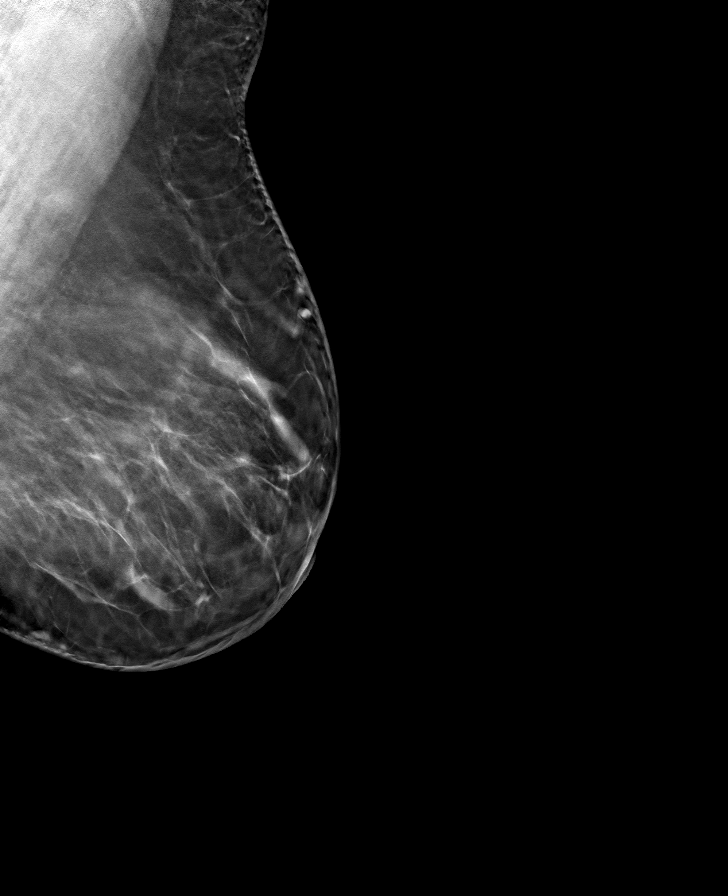

[8 of 24 positions shown; findings below may reference images not displayed]

ACR Breast Density Category b: There are scattered areas of
fibroglandular density.
FINDINGS: No suspicious masses or calcifications are seen in either breast.
The low density asymmetry in the upper-outer right breast
demonstrating imaging features suggestive of an area of focal
fibroglandular tissue appears unchanged when compared to the prior
exams. There is no mammographic evidence of malignancy in either
breast.

Targeted ultrasound of the upper-outer right breast was performed.
The focal area of dense fibroglandular tissue in the right breast at
10 o'clock 7 cm from nipple appears unchanged in size in appearance
measuring 2.7 x 0.7 x 3.6 cm. No suspicious masses or abnormality
seen in the upper-outer right breast.
IMPRESSION: No findings of malignancy in either breast.

RECOMMENDATION:
Screening mammogram in one year.(Code:O4-Y-931)

I have discussed the findings and recommendations with the patient.
If applicable, a reminder letter will be sent to the patient
regarding the next appointment.

BI-RADS CATEGORY  2: Benign.

## 2023-12-26 ENCOUNTER — Other Ambulatory Visit: Payer: Self-pay | Admitting: Obstetrics and Gynecology

## 2023-12-26 DIAGNOSIS — Z1231 Encounter for screening mammogram for malignant neoplasm of breast: Secondary | ICD-10-CM

## 2024-01-05 ENCOUNTER — Ambulatory Visit
Admission: RE | Admit: 2024-01-05 | Discharge: 2024-01-05 | Disposition: A | Discharge status: Home / Self Care | Source: Ambulatory Visit | Attending: Obstetrics and Gynecology | Admitting: Obstetrics and Gynecology

## 2024-01-05 DIAGNOSIS — Z1231 Encounter for screening mammogram for malignant neoplasm of breast: Secondary | ICD-10-CM
